# Patient Record
Sex: Female | Born: 1935 | Hispanic: No | Marital: Married
Health system: Southern US, Community
[De-identification: ages and names within clinical notes are randomized; demographics above are authoritative.]

---

## 1998-04-21 ENCOUNTER — Ambulatory Visit (HOSPITAL_BASED_OUTPATIENT_CLINIC_OR_DEPARTMENT_OTHER): Admission: RE | Admit: 1998-04-21 | Discharge: 1998-04-21 | Payer: Self-pay | Admitting: Orthopedic Surgery

## 2000-06-08 ENCOUNTER — Encounter: Admission: RE | Admit: 2000-06-08 | Discharge: 2000-06-08 | Payer: Self-pay | Admitting: Cardiovascular Disease

## 2000-06-08 ENCOUNTER — Encounter: Payer: Self-pay | Admitting: Cardiovascular Disease

## 2000-06-21 ENCOUNTER — Encounter: Admission: RE | Admit: 2000-06-21 | Discharge: 2000-06-21 | Payer: Self-pay | Admitting: Cardiovascular Disease

## 2000-06-21 ENCOUNTER — Encounter: Payer: Self-pay | Admitting: Cardiovascular Disease

## 2002-11-21 ENCOUNTER — Ambulatory Visit (HOSPITAL_BASED_OUTPATIENT_CLINIC_OR_DEPARTMENT_OTHER): Admission: RE | Admit: 2002-11-21 | Discharge: 2002-11-22 | Payer: Self-pay | Admitting: Orthopedic Surgery

## 2020-05-19 ENCOUNTER — Other Ambulatory Visit: Payer: Self-pay | Admitting: Orthopedic Surgery

## 2020-05-19 DIAGNOSIS — M545 Low back pain, unspecified: Secondary | ICD-10-CM

## 2020-05-19 DIAGNOSIS — M5441 Lumbago with sciatica, right side: Secondary | ICD-10-CM

## 2020-06-22 ENCOUNTER — Other Ambulatory Visit: Payer: Self-pay | Admitting: Orthopedic Surgery

## 2020-06-22 DIAGNOSIS — M545 Low back pain, unspecified: Secondary | ICD-10-CM

## 2020-06-22 DIAGNOSIS — M5441 Lumbago with sciatica, right side: Secondary | ICD-10-CM

## 2020-06-22 DIAGNOSIS — M67912 Unspecified disorder of synovium and tendon, left shoulder: Secondary | ICD-10-CM

## 2020-07-27 ENCOUNTER — Ambulatory Visit (INDEPENDENT_AMBULATORY_CARE_PROVIDER_SITE_OTHER): Payer: Medicare Other | Admitting: Dermatology

## 2020-07-27 ENCOUNTER — Encounter: Payer: Self-pay | Admitting: Dermatology

## 2020-07-27 ENCOUNTER — Other Ambulatory Visit: Payer: Self-pay

## 2020-07-27 DIAGNOSIS — L719 Rosacea, unspecified: Secondary | ICD-10-CM

## 2020-07-27 DIAGNOSIS — R21 Rash and other nonspecific skin eruption: Secondary | ICD-10-CM

## 2020-07-27 DIAGNOSIS — L603 Nail dystrophy: Secondary | ICD-10-CM

## 2020-07-27 DIAGNOSIS — L219 Seborrheic dermatitis, unspecified: Secondary | ICD-10-CM | POA: Diagnosis not present

## 2020-07-27 MED ORDER — KETOCONAZOLE 2 % EX SHAM: MEDICATED_SHAMPOO | CUTANEOUS | 3 refills | Status: AC

## 2020-07-27 MED ORDER — KETOCONAZOLE 2 % EX CREA: TOPICAL_CREAM | CUTANEOUS | 2 refills | Status: AC

## 2020-07-27 MED ORDER — CLOBETASOL PROPIONATE 0.05 % EX SOLN: CUTANEOUS | 2 refills | Status: AC

## 2020-07-27 NOTE — Patient Instructions (Addendum)
Gentle Skin Care Guide  1. Bathe no more than once a day.  2. Avoid bathing in hot water  3. Use a mild soap like Dove, Vanicream, Cetaphil, CeraVe. Can use Lever 2000 or Cetaphil antibacterial soap  4. Use soap only where you need it. On most days, use it under your arms, between your legs, and on your feet. Let the water rinse other areas unless visibly dirty.  5. When you get out of the bath/shower, use a towel to gently blot your skin dry, don't rub it.  6. While your skin is still a little damp, apply a moisturizing cream such as Vanicream, CeraVe, Cetaphil, Eucerin, Sarna lotion or plain Vaseline Jelly. For hands apply Neutrogena [place] Hand Cream or Excipial Hand Cream.  7. Reapply moisturizer any time you start to itch or feel dry.  8. Sometimes using free and clear laundry detergents can be helpful. Fabric softener sheets should be avoided. Downy Free & Gentle liquid, or any liquid fabric softener that is free of dyes and perfumes, it acceptable to use  9. If your doctor has given you prescription creams you may apply moisturizers over them    Start Ketoconazole cream apply to eye brow and nose twice a day  Start Ketoconazole shampoo apply to scalp daily leave in for 10 mins  Start Clobetasol solution apply to scalp daily as need for itchy avoid face/groin/axilla

## 2020-07-27 NOTE — Progress Notes (Signed)
New Patient Visit  Subjective  Alexandra Hammond is a 86 y.o. female who presents for the following: Rash (New pt present with a rash on the face, mouth, back, shoulders x 20 years itching ).  Son with pt contributing to history   The following portions of the chart were reviewed this encounter and updated as appropriate:   Allergies  Meds  Problems  Med Hx  Surg Hx  Fam Hx      Review of Systems:  No other skin or systemic complaints except as noted in HPI or Assessment and Plan.  Objective  Well appearing patient in no apparent distress; mood and affect are within normal limits.  A focused examination was performed including face,neck,scalp,back,shoulders . Relevant physical exam findings are noted in the Assessment and Plan.  Objective  back,: Hyperpigmented macules coalescing to patches at back Mild erythema of eyelids  Objective  face, scalp, ears: Pink patches with scale  Objective  Head - Anterior (Face): Mid face erythema with telangiectasias  Objective  Right 3rd Finger Nail Plate: Nails with longitudinal lines + xerosis of skin   Assessment & Plan  Rash back,  Present for decades  Chronic condition with duration of decades. Condition is bothersome to patient. Currently flared.  Possible Macular Amyloid by exam  Per history, two biopsies have been "negative"  We will request records from previous clinics to see biopsy results and previous management  She has used fluocinolone ointment occasionally without resolution of the rash  At follow-up will do more extensive exam including full back and shoulders, chest, hands to be sure not missing subtle dermatomyositis given slight erythema of eyelids + pruritus of scalp + xerotic fingertips.    Seborrheic dermatitis face, scalp, ears  Chronic condition with duration over one year. Condition is bothersome to patient. Currently flared.  Seborrheic Dermatitis is a chronic persistent rash  characterized by pinkness and scaling most commonly of the mid face but also can occur on the scalp (dandruff), ears; mid chest and mid back.  It tends to be exacerbated by stress and cooler weather.  People who have neurologic disease may experience new onset or exacerbation of existing seborrheic dermatitis.  The condition is not curable but treatable and can be controlled.   Start Ketoconazole cream apply to eyebrow and nose twice a day as needed Start Ketoconazole shampoo apply three times per week, massage into scalp and leave in for 10 minutes before rinsing out Start Clobetasol solution apply to scalp daily as need for itch. Avoid applying to face, groin, and axilla. Use as directed. Risk of skin atrophy with long-term use reviewed.   Topical steroids (such as triamcinolone, fluocinolone, fluocinonide, mometasone, clobetasol, halobetasol, betamethasone, hydrocortisone) can cause thinning and lightening of the skin if they are used for too long in the same area. Your physician has selected the right strength medicine for your problem and area affected on the body. Please use your medication only as directed by your physician to prevent side effects.     Ordered Medications: ketoconazole (NIZORAL) 2 % cream ketoconazole (NIZORAL) 2 % shampoo clobetasol (TEMOVATE) 0.05 % external solution  Rosacea Head - Anterior (Face)  Symptomatic per patient  Discussed prescription treatment with PO doxycycline 20 mg twice a day vs topicals but patient defers at this time  Rosacea is a chronic progressive skin condition usually affecting the face of adults, causing redness and/or acne bumps. It is treatable but not curable. It sometimes affects the eyes (ocular rosacea)  as well. It may respond to topical and/or systemic medication and can flare with stress, sun exposure, alcohol, exercise and some foods.  Daily application of broad spectrum spf 30+ sunscreen to face is recommended to reduce flares.     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  I, Angelique Holm, CMA, am acting as scribe for Darden Dates, MD .  Documentation: I have reviewed the above documentation for accuracy and completeness, and I agree with the above.  Darden Dates, MD

## 2020-07-27 NOTE — Progress Notes (Incomplete)
New Patient Visit  Subjective  Alexandra Hammond is a 86 y.o. female who presents for the following: Rash (New pt present with a rash on the face, mouth, back, shoulders x 20 years itching ).  Son with pt contributing to history   The following portions of the chart were reviewed this encounter and updated as appropriate:   Allergies  Meds  Problems  Med Hx  Surg Hx  Fam Hx      Review of Systems:  No other skin or systemic complaints except as noted in HPI or Assessment and Plan.  Objective  Well appearing patient in no apparent distress; mood and affect are within normal limits.  A focused examination was performed including face,neck,scalp,back,shoulders . Relevant physical exam findings are noted in the Assessment and Plan.  Objective  back,: Hyperpigmented macules coalescing to patches at back Mild erythema of eyelids  Objective  face, scalp, ears: Pink patches with scale  Objective  Head - Anterior (Face): Mid face erythema with telangiectasias  Objective  Right 3rd Finger Nail Plate: Nails with longitudinal lines + xerosis of skin   Assessment & Plan  Rash back,  Present for decades  Possible Macular Amyloid by exam  Per history, two biopsies have been "negative"  We will request records from previous clinics to see biopsy results and previous management  She has used fluocinolone ointment occasionally without resolution of the rash  At follow-up will do more extensive exam including full back and shoulders, chest, hands to be sure not missing subtle dermatomyositis given slight erythema of eyelids + pruritus of scalp + xerotic fingertips.    Seborrheic dermatitis face, scalp, ears  Chronic condition with duration over one year. Condition is bothersome to patient. Currently flared.  Seborrheic Dermatitis is a chronic persistent rash characterized by pinkness and scaling most commonly of the mid face but also can occur on the scalp  (dandruff), ears; mid chest and mid back.  It tends to be exacerbated by stress and cooler weather.  People who have neurologic disease may experience new onset or exacerbation of existing seborrheic dermatitis.  The condition is not curable but treatable and can be controlled.   Start Ketoconazole cream apply to eyebrow and nose twice a day as needed Start Ketoconazole shampoo apply three times per week, massage into scalp and leave in for 10 minutes before rinsing out Start Clobetasol solution apply to scalp daily as need for itch. Avoid applying to face, groin, and axilla. Use as directed. Risk of skin atrophy with long-term use reviewed.   Topical steroids (such as triamcinolone, fluocinolone, fluocinonide, mometasone, clobetasol, halobetasol, betamethasone, hydrocortisone) can cause thinning and lightening of the skin if they are used for too long in the same area. Your physician has selected the right strength medicine for your problem and area affected on the body. Please use your medication only as directed by your physician to prevent side effects.     Ordered Medications: ketoconazole (NIZORAL) 2 % cream ketoconazole (NIZORAL) 2 % shampoo clobetasol (TEMOVATE) 0.05 % external solution  Rosacea Head - Anterior (Face)  Symptomatic per patient  Discussed prescription treatment with PO doxycycline 20 mg twice a day vs topicals but patient defers at this time  Rosacea is a chronic progressive skin condition usually affecting the face of adults, causing redness and/or acne bumps. It is treatable but not curable. It sometimes affects the eyes (ocular rosacea) as well. It may respond to topical and/or systemic medication and can flare with  stress, sun exposure, alcohol, exercise and some foods.  Daily application of broad spectrum spf 30+ sunscreen to face is recommended to reduce flares.    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  I, Angelique Holm, CMA, am acting as scribe for Darden Dates, MD .  Documentation: I have reviewed the above documentation for accuracy and completeness, and I agree with the above.  Darden Dates, MD

## 2020-07-29 ENCOUNTER — Telehealth: Payer: Self-pay

## 2020-07-29 MED ORDER — MOMETASONE FUROATE 0.1 % EX SOLN: Freq: Every day | CUTANEOUS | 0 refills | Status: AC

## 2020-07-29 NOTE — Telephone Encounter (Signed)
Please try mometasone solution or if not covered fluocinolone solution. Thank you!

## 2020-07-29 NOTE — Telephone Encounter (Signed)
Mometasone Solution sent in.

## 2020-07-29 NOTE — Telephone Encounter (Signed)
Fax from [employer] Clobetasol Solution not covered by patient's plan. Please advise.

## 2020-08-09 ENCOUNTER — Other Ambulatory Visit: Payer: Self-pay | Admitting: Unknown Physician Specialty

## 2020-08-09 ENCOUNTER — Encounter: Payer: Self-pay | Admitting: Dermatology

## 2020-08-09 DIAGNOSIS — H9211 Otorrhea, right ear: Secondary | ICD-10-CM

## 2020-08-20 ENCOUNTER — Telehealth: Payer: Self-pay

## 2020-08-20 NOTE — Telephone Encounter (Signed)
Dr. Rivka Safer received a referral for the patient from Dr. Mikey Bussing office(ENT).  Per Dr. Rivka Safer patient should be referred to another clinic if they are wanting the patient to be soon. Dr. Rivka Safer will be leaving for vacation next week and will be out of the office for a month.  I have called and left a message with Dr. Mikey Bussing nurse Penni Bombard to inform her that patient may need to be referred to another clinic.  Alexandra Hammond

## 2020-08-24 ENCOUNTER — Other Ambulatory Visit: Payer: Self-pay | Admitting: Internal Medicine

## 2020-08-24 DIAGNOSIS — Z1231 Encounter for screening mammogram for malignant neoplasm of breast: Secondary | ICD-10-CM

## 2020-08-25 ENCOUNTER — Ambulatory Visit
Admission: RE | Admit: 2020-08-25 | Discharge: 2020-08-25 | Disposition: A | Discharge status: Home / Self Care | Payer: Medicare Other | Source: Ambulatory Visit | Attending: Unknown Physician Specialty | Admitting: Unknown Physician Specialty

## 2020-08-25 ENCOUNTER — Other Ambulatory Visit: Payer: Self-pay

## 2020-08-25 DIAGNOSIS — H9211 Otorrhea, right ear: Secondary | ICD-10-CM | POA: Diagnosis not present

## 2020-09-09 ENCOUNTER — Ambulatory Visit: Payer: Medicare Other | Admitting: Dermatology

## 2020-10-06 ENCOUNTER — Inpatient Hospital Stay: Admission: RE | Admit: 2020-10-06 | Payer: Medicare Other | Source: Ambulatory Visit

## 2020-10-22 ENCOUNTER — Encounter: Admission: RE | Payer: Self-pay | Source: Home / Self Care

## 2020-10-22 ENCOUNTER — Ambulatory Visit: Admission: RE | Admit: 2020-10-22 | Payer: Medicare Other | Source: Home / Self Care

## 2020-10-22 SURGERY — COLONOSCOPY WITH PROPOFOL
Anesthesia: General

## 2021-01-12 ENCOUNTER — Ambulatory Visit: Payer: Self-pay | Admitting: Dermatology

## 2022-03-02 IMAGING — CT CT TEMPORAL BONES W/O CM
3 of 7 series · 14 of 40 positions shown, 16 images · non-contrast
Comparison: Report of skull radiographs 06/21/2000 (no images
available).

CLINICAL DATA: 85-year-old female with chronic right ear pain. Ear
infection this year and drainage from that ear. Currently on
antibiotics.

EXAM:
CT TEMPORAL BONES WITHOUT CONTRAST
TECHNIQUE: Axial and coronal plane CT imaging of the petrous temporal bones was
performed with thin-collimation image reconstruction. No intravenous
contrast was administered. Multiplanar CT image reconstructions were
also generated.

[Series 2: ax bone temperal bones 0.60 · axial · 0.33mm/px · z∈[-659,-623]mm · 7 of 83 slices shown, 9 images]
[im 11/83  brain]
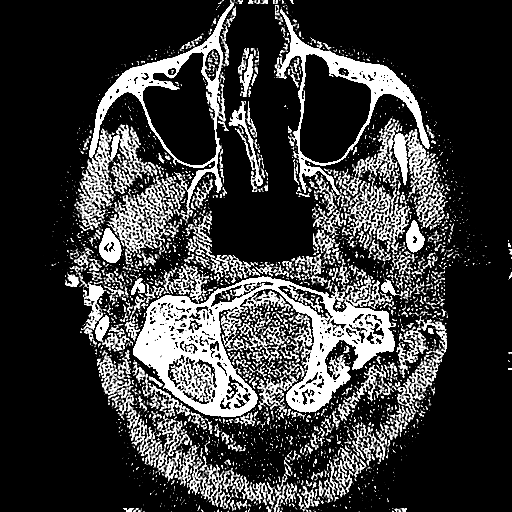
[im 11/83  bone]
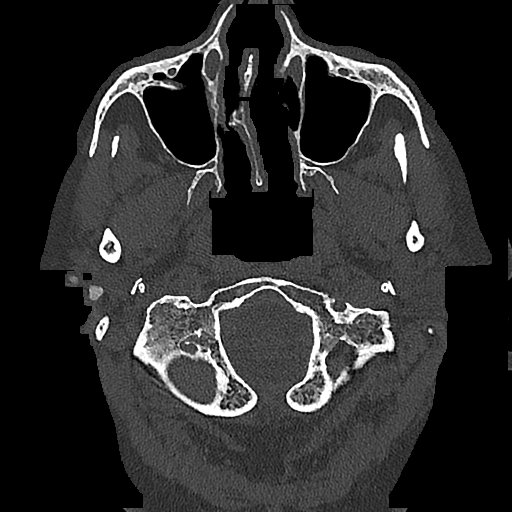
[im 21/83  bone]
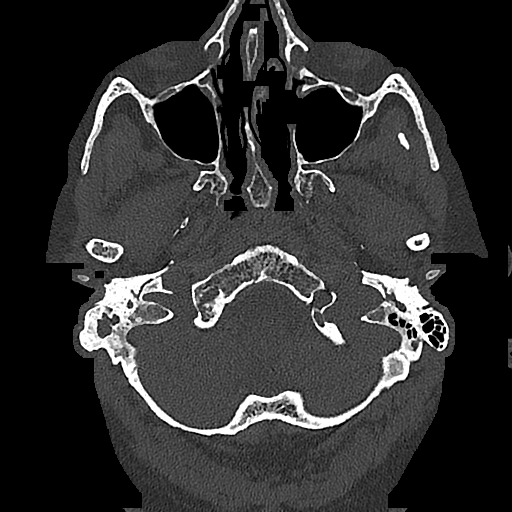
[im 31/83  bone]
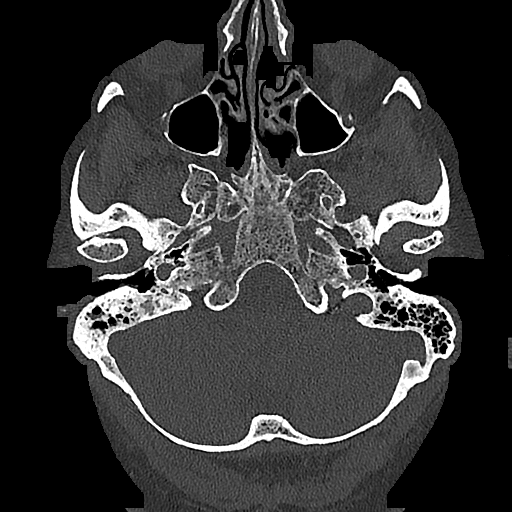
[im 42/83  bone]
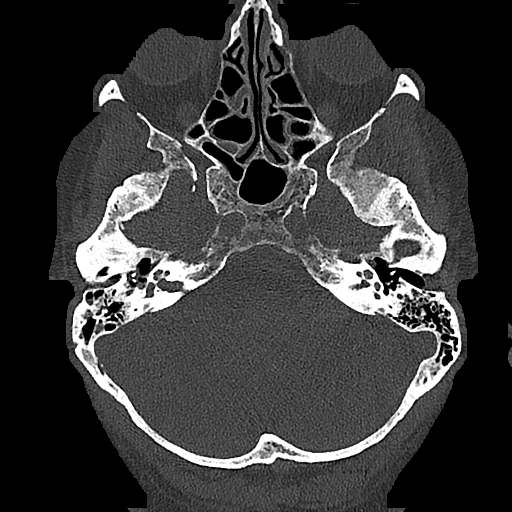
[im 52/83  brain]
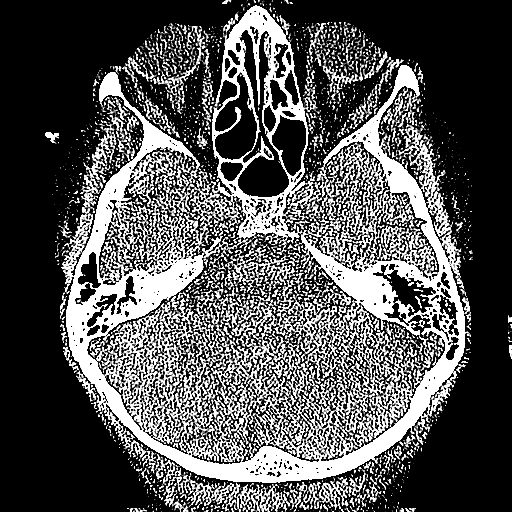
[im 52/83  bone]
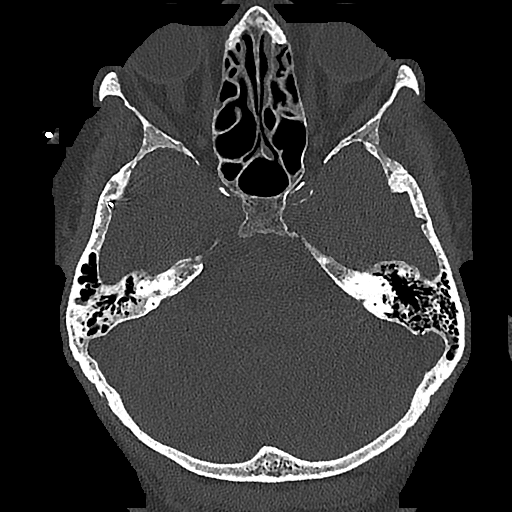
[im 62/83  bone]
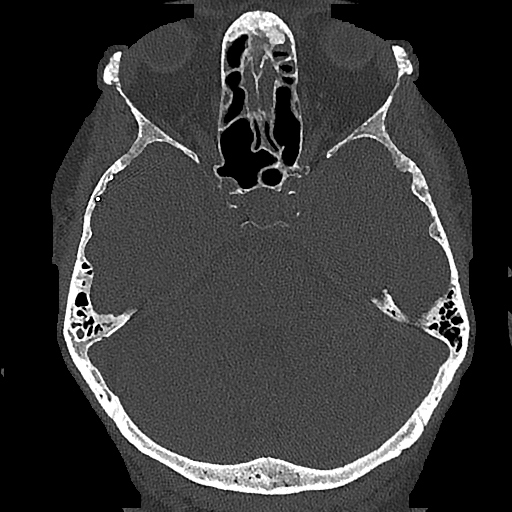
[im 72/83  bone]
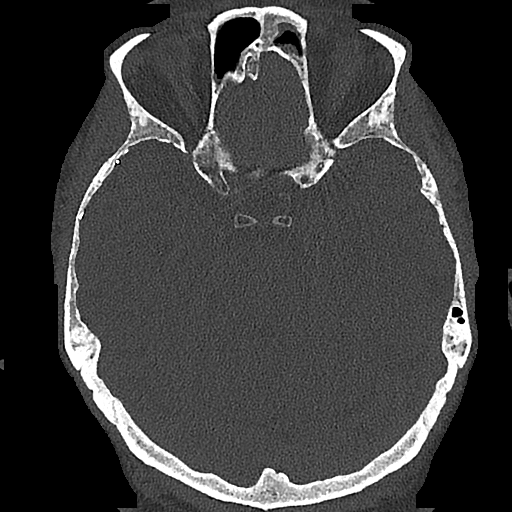

[Series 8: ax mag right temperal bones 0.60 · axial · 0.20mm/px · z∈[-661,-636]mm · 5 of 83 slices shown]
[im 11/83  bone]
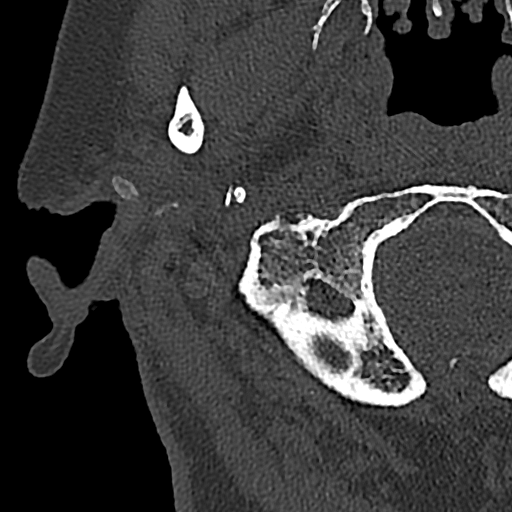
[im 21/83  bone]
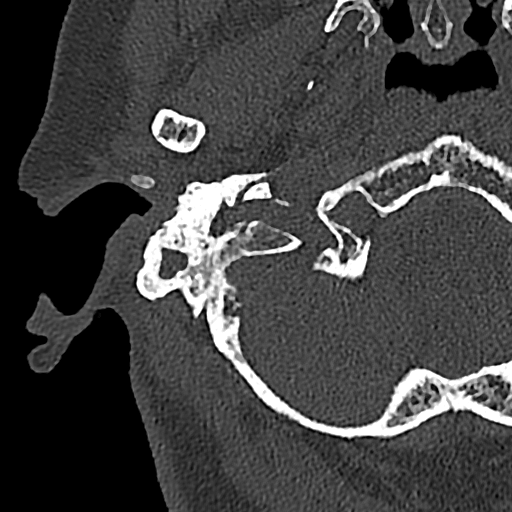
[im 31/83  bone]
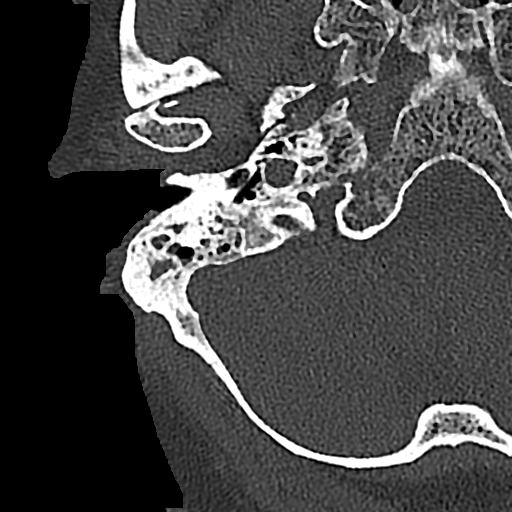
[im 42/83  bone]
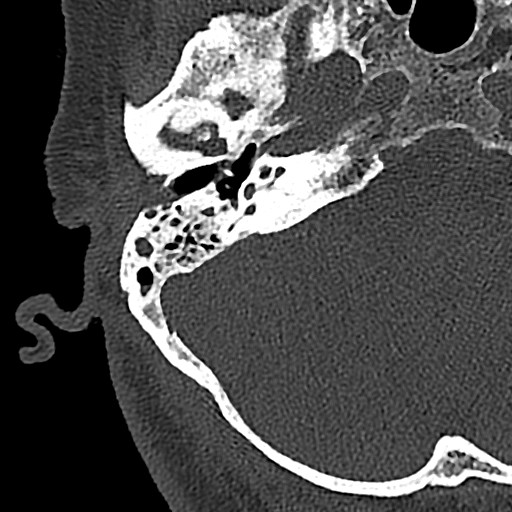
[im 52/83  bone]
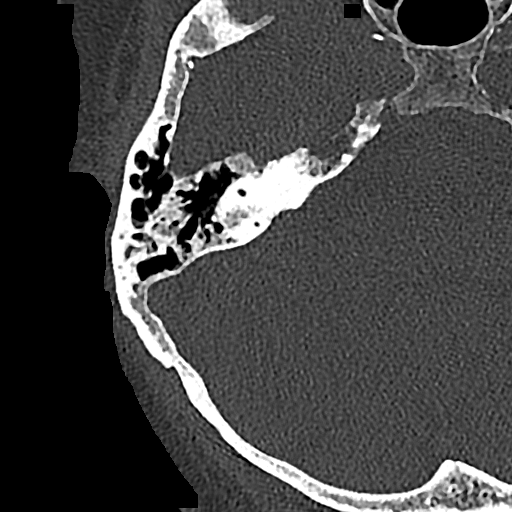

[Series 10: cor mag right temperal bones 0.80 cor · coronal · 0.10mm/px · 2 of 125 slices shown]
[im 42/125  bone]
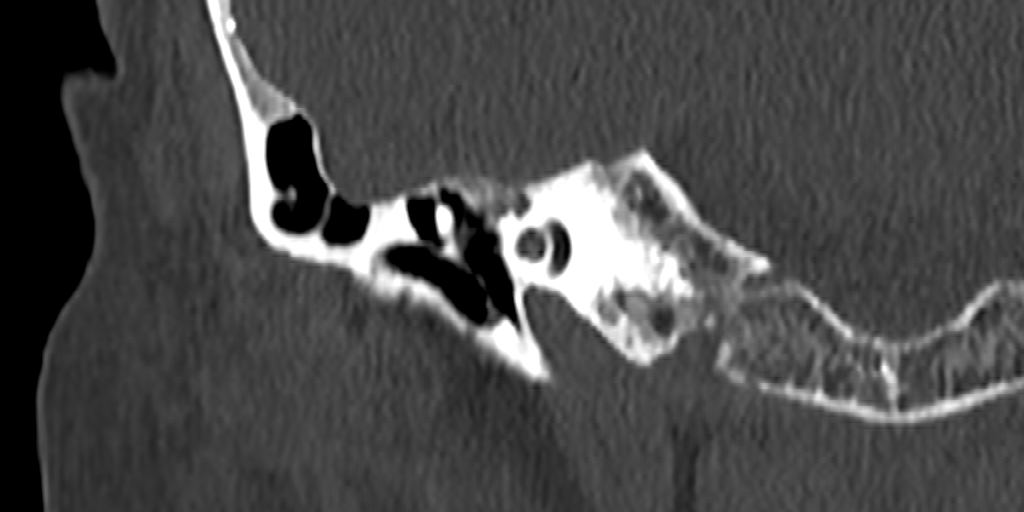
[im 83/125  bone]
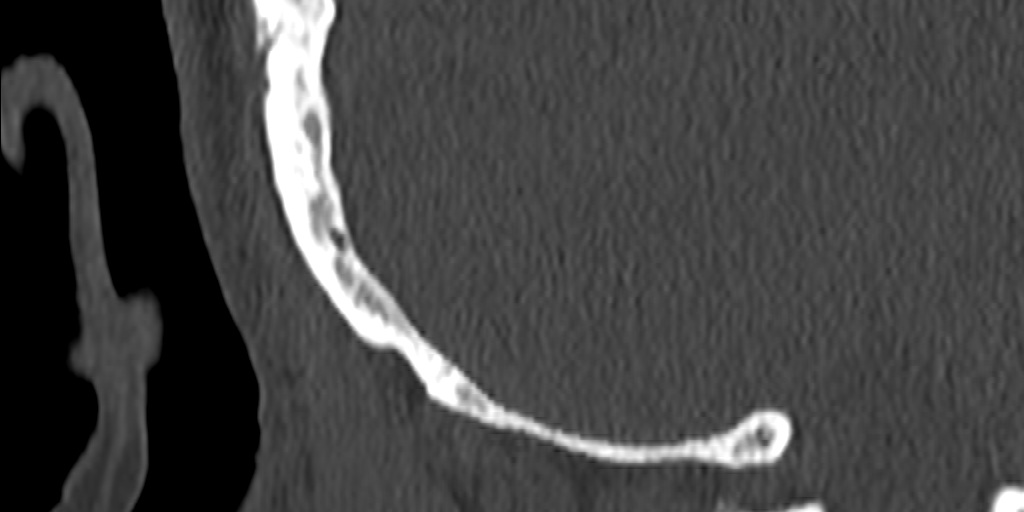

[14 of 40 positions shown; findings below may reference images not displayed]

FINDINGS: Visible noncontrast brain parenchyma is negative for age. Calcified
atherosclerosis at the skull base. Negative visible orbits,
postoperative changes to both globes. Negative visible noncontrast
scalp and face soft tissues.

Hypoplastic left frontal sinus with mild to moderate left
frontoethmoidal recess mucosal thickening. Other visible paranasal
sinuses are well aerated.

Osteopenia at the central skull base. Metallic foreign body is
visible inside the right middle cranial fossa on series 2, image 58.
This has a wire configuration, although etiology is unclear. No
overlying craniotomy changes are identified.

LEFT TEMPORAL BONE:

Left EAC is within normal limits. The left tympanic membrane appears
diminutive and normal. The left tympanic cavity is well aerated. The
left scutum and ossicles are intact. Ossicles appear normally
aligned. Left mastoid antrum and air cells are clear.

Left IAC, cochlea, vestibule and vestibular aqueduct are within
normal limits. Left semicircular canals and course of the left 7th
nerve appear normal.

RIGHT TEMPORAL BONE:

There is mild debris or material within the caudal aspect of the
right external auditory canal, associated with thickening of the
right tympanic membrane as seen on series 8, image 33 and series 10,
image 42. The scutum remains intact. There is a similar small volume
of opacity along the right side ossicles, adjacent to both the
malleus (series 10, image 43) and incus (series 8, image 48).
Prussak's space is relatively spared.

Bubbly opacity is superimposed in the right mastoid antrum on series
8, image 53. There is scattered opacification of right mastoid air
cells, some demonstrating small fluid levels. Background mastoid
bone mineralization seems to remain normal. No mastoid coalescence
is identified.

Right IAC, cochlea, vestibule, vestibular aqueduct and right
semicircular canals appear normal. Course of the right 7th nerve
remains normal.
IMPRESSION: 1. Thickening of the right tympanic membrane associated with
scattered opacity/fluid in the right tympanic cavity and mastoids.
Small volume fluid or debris also in the right EAC. But no
associated bone or ossicle erosion. Favor this is sequelae of otitis
media.

2. Normal left temporal bone.

3. Small wire-shaped metallic foreign body located inside the skull
along the lateral right middle cranial fossa. This is of unclear
etiology and significance. No overlying craniotomy changes are
identified.

4. Mild to moderate left frontoethmoidal sinus disease.
